# Patient Record
Sex: Female | Born: 1990 | Race: White | Marital: Single | Smoking: Never smoker
Health system: Southern US, Community
[De-identification: ages and names within clinical notes are randomized; demographics above are authoritative.]

---

## 2018-04-29 ENCOUNTER — Ambulatory Visit: Payer: Self-pay | Admitting: Family Medicine

## 2018-04-29 ENCOUNTER — Other Ambulatory Visit: Payer: Self-pay

## 2018-04-29 ENCOUNTER — Ambulatory Visit
Admission: RE | Admit: 2018-04-29 | Discharge: 2018-04-29 | Disposition: A | Discharge status: Home / Self Care | Payer: Self-pay | Source: Ambulatory Visit | Attending: Family Medicine | Admitting: Family Medicine

## 2018-04-29 ENCOUNTER — Encounter: Payer: Self-pay | Admitting: Family Medicine

## 2018-04-29 VITALS — BP 98/64 | HR 77 | Temp 98.2°F | Ht 62.5 in | Wt 104.6 lb

## 2018-04-29 DIAGNOSIS — Y9315 Activity, underwater diving and snorkeling: Secondary | ICD-10-CM

## 2018-04-29 DIAGNOSIS — Z0289 Encounter for other administrative examinations: Secondary | ICD-10-CM

## 2018-04-29 LAB — POCT UA - GLUCOSE/PROTEIN
GLUCOSE UA: NEGATIVE
Protein, UA: NEGATIVE

## 2018-04-29 LAB — GLUCOSE, POCT (MANUAL RESULT ENTRY): POC GLUCOSE: 89 mg/dL (ref 70–99)

## 2018-04-29 LAB — POCT HEMOGLOBIN: Hemoglobin: 14.2 g/dL — AB (ref 9.5–13.5)

## 2018-04-29 NOTE — Progress Notes (Signed)
S:    Patient arrives in good spirits without complaint.    Presents for lung function evaluation for "dive physical". Patient reports breathing has been without issue.   Denies history or breathing difficulty.   O: Patient provided good effort while attempting spirometry.  FVC 3.40    Calculated Lower Limit for [employer] Diving Standards   FVC = 94.4 FEV1 3.02       Calculated Lower Limit for [employer] Diving Standards   FEV1= 96.8 FEV1/FVC  88.7   Calculated Lower Limit for [employer] Diving Standards   FEV1/FVC = 103.8  See "scanned report" or Documentation Flowsheet (discrete results - PFTs) for  Spirometry results and copy of evaluation.   A/P: Spirometry evaluation without bronchodilator reveals normal lung function.  FEV1, FVC and FEV1/FVC ratio all exceed threshold for spirometric parameters.   Reviewed results of pulmonary function tests.  Pt verbalized understanding of results and education.  Patient seen with Belva Agee, PharmD Candidate.

## 2018-04-29 NOTE — Progress Notes (Signed)
Subjective:    Melissa Hampton is a 27 y.o. female who presents to [facility] today for scuba diving physical for the [facility]:  1.  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  Denies any complications or injuries while diving.  Has never failed a fitness to dive physical.  Currently well, without complaints.  The following portions of the patient's history were reviewed and updated as appropriate: allergies, current medications, past medical history, family and social history, and problem list.  Medications reviewed. Current Outpatient Medications  Medication Sig Dispense Refill  . etonogestrel (NEXPLANON) 68 MG IMPL implant 1 each by Subdermal route once.     No current facility-administered medications for this visit.      PMH:   - No diagnoses that she knows of, or medical conditions. - No other hospitalizations or other prior medical history   PSH: - denies any surgery.  S/p Nexplanon insertion  Family History: - no family history of cardiac, pulmonary, neurological issues.    Social: - Never smoker - Denies illicit drug use - Very occasional social drinker (1-2 drinks)  SCUBA ROS:  He denies any history of middle ear trauma/disease, vertigo, ocular surgery, asthma or other respiratory issues, seizures, loss of consciousness, recurring neurologic disorders, history of head injury, coagulopathies, evidence of CAD or other structural heart disease, pneumothorax, diabetes, or exercise intolerance.    General ROS:  The patient denies fever, unusual weight change, decreased hearing, chest pain, palpitations, pre-syncopal or syncopal episodes, dyspnea on exertion, prolonged cough, hemoptysis, change in bowel habits, melena, hematochezia, severe indigestion/heartburn, nausea/vomiting/abdominal pain, genital sores, muscle weakness, difficulty walking, abnormal bleeding, or enlarged lymph nodes.     Objective:   Physical  Exam BP 98/64   Pulse 77   Temp 98.2 F (36.8 C) (Oral)   Ht 5' 2.5" (1.588 m)   Wt 104 lb 9.6 oz (47.4 kg)   SpO2 99%   BMI 18.83 kg/m  Gen:  Alert, cooperative patient who appears stated age in no acute distress.  Vital signs reviewed. Head:  [place]/AT Eyes:  Fundoscopy WNL BL.  PERRL, EOMI Ears:  External ears WNL, Bilateral TM's normal without retraction, redness or bulging.  Canals clear BL  Mouth:  Good dental hygiene. Tonsils non-erythematous, non-edematous.   MMM Neck:  Trachea midline Cardiac:  Regular rate and rhythm without murmur auscultated.   Pulm:  Clear to auscultation bilaterally with good air movement throuhout.  No wheezes or rales noted.   Abd:  Soft/nondistended/nontender.  Good bowel sounds throughout all four quadrants.  No masses noted.  Exts: No edema BL LE's, warm and well-perfused Neuro:  Alert and oriented to person, place, and date.  CN II-XII intact.  Sensation intact to light touch and vibration bilateral upper and lower extremities equally.  Motor function equal and strength 5/5 bilateral upper and lower extremities.  Normal gait.  DTRs +2 BL patellar.  Finger to nose cerebellar testing within normal limits.  Color vision testing normal. Psych:  Not depressed or anxious appearing.  Linear and coherent thought process as evidenced by speech pattern. Smiles spontaneously.   Results for orders placed or performed in visit on 04/29/18 (from the past 72 hour(s))  Glucose (CBG)     Status: None   Collection Time: 04/29/18  9:30 AM  Result Value Ref Range   POC Glucose 89 70 - 99 mg/dl  Hemoglobin     Status: Abnormal   Collection Time: 04/29/18  9:30 AM  Result Value Ref Range   Hemoglobin 14.2 (A) 9.5 - 13.5 g/dL  Urinalysis - Glucose/Protein     Status: None   Collection Time: 04/29/18  9:30 AM  Result Value Ref Range   Glucose, UA Negative Negative   Protein, UA Negative Negative

## 2018-04-29 NOTE — Assessment & Plan Note (Signed)
Vision (distance, near, color), hearing, UA, CBG, Hgb, and Spirometry all within normal limits. Normal CXR  EKG:  n/a Coronary assessment:  N/a due to age Approval for SCUBA diving, I find no medical conditions considered incompatible with diving.    

## 2018-04-29 NOTE — Patient Instructions (Signed)
It was good to meet you today.   Good luck with work.  [facility], and then come back to pick up your paperwork.

## 2018-05-15 NOTE — Progress Notes (Signed)
Chief Complaint  Patient presents with  . Establish Care    discuss left knee issues x intermittent for years but has worsened over the last 1-2 months    HPI  Patient reports that she has been having intermittent knee pain on the left for years She resumed fencing as a sport and with lunges she feels crunching and pain with the left knee pain She has no weakness of the knee She has not tried any medications to alleviate this Her mother and grandmother both have arthritis  History reviewed. No pertinent past medical history.  Current Outpatient Medications  Medication Sig Dispense Refill  . etonogestrel (NEXPLANON) 68 MG IMPL implant 1 each by Subdermal route once.    . meloxicam (MOBIC) 7.5 MG tablet Take 1 tablet (7.5 mg total) by mouth daily. Take with food. 30 tablet 6   No current facility-administered medications for this visit.     Allergies:  Allergies  Allergen Reactions  . Morphine And Related     History reviewed. No pertinent surgical history.  Social History   Socioeconomic History  . Marital status: Single    Spouse name: Not on file  . Number of children: Not on file  . Years of education: Not on file  . Highest education level: Not on file  Occupational History  . Not on file  Social Needs  . Financial resource strain: Not on file  . Food insecurity:    Worry: Not on file    Inability: Not on file  . Transportation needs:    Medical: Not on file    Non-medical: Not on file  Tobacco Use  . Smoking status: Never Smoker  . Smokeless tobacco: Never Used  Substance and Sexual Activity  . Alcohol use: Not on file  . Drug use: Not on file  . Sexual activity: Not on file  Lifestyle  . Physical activity:    Days per week: Not on file    Minutes per session: Not on file  . Stress: Not on file  Relationships  . Social connections:    Talks on phone: Not on file    Gets together: Not on file    Attends religious service: Not on file    Active  member of club or organization: Not on file    Attends meetings of clubs or organizations: Not on file    Relationship status: Not on file  Other Topics Concern  . Not on file  Social History Narrative  . Not on file    Family History  Problem Relation Age of Onset  . Arthritis Mother   . Arthritis Maternal Grandmother      ROS Review of Systems See HPI Constitution: No fevers or chills No malaise No diaphoresis Skin: No rash or itching Eyes: no blurry vision, no double vision GU: no dysuria or hematuria Neuro: no dizziness or headaches all others reviewed and negative   Objective: Vitals:   05/16/18 0948  BP: 123/81  Pulse: 95  Resp: 16  Temp: 98.8 F (37.1 C)  TempSrc: Oral  SpO2: 98%  Weight: 104 lb (47.2 kg)  Height: 5' 2.5" (1.588 m)    Physical Exam  Constitutional: She is oriented to person, place, and time. She appears well-developed and well-nourished.  HENT:  Head: Normocephalic and atraumatic.  Eyes: Conjunctivae and EOM are normal.  Pulmonary/Chest: Effort normal.  Musculoskeletal:  Left knee without crepitus, no effusion, no joint line tenderness, no clicks, normal alignment  Neurological: She  is alert and oriented to person, place, and time.      Left knee xray without bone abnormalities No effusion   Assessment and Plan Melissa AmenJulia was seen today for establish care.  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 Take with food.     Melissa Hampton A Sagan Maselli

## 2018-05-16 ENCOUNTER — Other Ambulatory Visit: Payer: Self-pay

## 2018-05-16 ENCOUNTER — Ambulatory Visit (INDEPENDENT_AMBULATORY_CARE_PROVIDER_SITE_OTHER): Payer: BLUE CROSS/BLUE SHIELD

## 2018-05-16 ENCOUNTER — Encounter: Payer: Self-pay | Admitting: Family Medicine

## 2018-05-16 ENCOUNTER — Ambulatory Visit: Payer: BLUE CROSS/BLUE SHIELD | Admitting: Family Medicine

## 2018-05-16 VITALS — BP 123/81 | HR 95 | Temp 98.8°F | Resp 16 | Ht 62.5 in | Wt 104.0 lb

## 2018-05-16 DIAGNOSIS — M25562 Pain in left knee: Secondary | ICD-10-CM

## 2018-05-16 DIAGNOSIS — G8929 Other chronic pain: Secondary | ICD-10-CM

## 2018-05-16 MED ORDER — MELOXICAM 7.5 MG PO TABS: 7.5000 mg | ORAL_TABLET | Freq: Every day | ORAL | 6 refills | Status: AC

## 2018-05-16 NOTE — Patient Instructions (Addendum)
   If you have lab work done today you will be contacted with your lab results within the next 2 weeks.  If you have not heard from us then please contact us. The fastest way to get your results is to register for My Chart.   IF you received an x-ray today, you will receive an invoice from [facility]. Please contact [facility] at [phone] with questions or concerns regarding your invoice.   IF you received labwork today, you will receive an invoice from [facility]. Please contact [facility] at [phone] with questions or concerns regarding your invoice.   Our billing staff will not be able to assist you with questions regarding bills from these companies.  You will be contacted with the lab results as soon as they are available. The fastest way to get your results is to activate your My Chart account. Instructions are located on the last page of this paperwork. If you have not heard from us regarding the results in 2 weeks, please contact this office.     Knee Pain, Adult Knee pain in adults is common. It can be caused by many things, including:  Arthritis.  A fluid-filled sac (cyst) or growth in your knee.  An infection in your knee.  An injury that will not heal.  Damage, swelling, or irritation of the tissues that support your knee.  Knee pain is usually not a sign of a serious problem. The pain may go away on its own with time and rest. If it does not, a health care provider may order tests to find the cause of the pain. These may include:  Imaging tests, such as an X-ray, MRI, or ultrasound.  Joint aspiration. In this test, fluid is removed from the knee.  Arthroscopy. In this test, a lighted tube is inserted into knee and an image is projected onto a TV screen.  A biopsy. In this test, a sample of tissue is removed from the body and studied under a microscope.  Follow these instructions at home: Pay attention to any changes in your symptoms. Take  these actions to relieve your pain. Activity  Rest your knee.  Do not do things that cause pain or make pain worse.  Avoid high-impact activities or exercises, such as running, jumping rope, or doing jumping jacks. General instructions  Take over-the-counter and prescription medicines only as told by your health care provider.  Raise (elevate) your knee above the level of your heart when you are sitting or lying down.  Sleep with a pillow under your knee.  If directed, apply ice to the knee: ? Put ice in a plastic bag. ? Place a towel between your skin and the bag. ? Leave the ice on for 20 minutes, 2-3 times a day.  Ask your health care provider if you should wear an elastic knee support.  Lose weight if you are overweight. Extra weight can put pressure on your knee.  Do not use any products that contain nicotine or tobacco, such as cigarettes and e-cigarettes. Smoking may slow the healing of any bone and joint problems that you may have. If you need help quitting, ask your health care provider. Contact a health care provider if:  Your knee pain continues, changes, or gets worse.  You have a fever along with knee pain.  Your knee buckles or locks up.  Your knee swells, and the swelling becomes worse. Get help right away if:  Your knee feels warm to the touch.    You cannot move your knee.  You have severe pain in your knee.  You have chest pain.  You have trouble breathing. Summary  Knee pain in adults is common. It can be caused by many things, including, arthritis, infection, cysts, or injury.  Knee pain is usually not a sign of a serious problem, but if it does not go away, a health care provider may perform tests to know the cause of the pain.  Pay attention to any changes in your symptoms. Relieve your pain with rest, medicines, light activity, and use of ice.  Get help if your pain continues or becomes very severe, or if your knee buckles or locks up, or if  you have chest pain or trouble breathing. This information is not intended to replace advice given to you by your health care provider. Make sure you discuss any questions you have with your health care provider. Document Released: 04/15/2007 Document Revised: 06/08/2016 Document Reviewed: 06/08/2016 Elsevier Interactive Patient Education  2018 [employer]vier Inc[employer]  

## 2018-07-16 ENCOUNTER — Emergency Department (HOSPITAL_COMMUNITY): Payer: BLUE CROSS/BLUE SHIELD

## 2018-07-16 ENCOUNTER — Emergency Department (HOSPITAL_COMMUNITY)
Admission: EM | Admit: 2018-07-16 | Discharge: 2018-07-16 | Disposition: A | Discharge status: Home / Self Care | Payer: BLUE CROSS/BLUE SHIELD | Attending: Emergency Medicine | Admitting: Emergency Medicine

## 2018-07-16 ENCOUNTER — Other Ambulatory Visit: Payer: Self-pay

## 2018-07-16 ENCOUNTER — Encounter (HOSPITAL_COMMUNITY): Payer: Self-pay | Admitting: Emergency Medicine

## 2018-07-16 DIAGNOSIS — Z79899 Other long term (current) drug therapy: Secondary | ICD-10-CM | POA: Diagnosis not present

## 2018-07-16 DIAGNOSIS — M7071 Other bursitis of hip, right hip: Secondary | ICD-10-CM | POA: Diagnosis not present

## 2018-07-16 DIAGNOSIS — Y999 Unspecified external cause status: Secondary | ICD-10-CM | POA: Diagnosis not present

## 2018-07-16 DIAGNOSIS — Y9301 Activity, walking, marching and hiking: Secondary | ICD-10-CM | POA: Insufficient documentation

## 2018-07-16 DIAGNOSIS — M25551 Pain in right hip: Secondary | ICD-10-CM | POA: Diagnosis present

## 2018-07-16 LAB — POC URINE PREG, ED: PREG TEST UR: NEGATIVE

## 2018-07-16 NOTE — ED Provider Notes (Signed)
[facility]-EMERGENCY DEPT Provider Note   CSN: [id number] Arrival date & time: 07/16/18  2055     History   Chief Complaint Chief Complaint  Patient presents with  . Leg Pain    HPI Dipali Basquez is a 28 y.o. female.  28 year old female presents with complaint of pain in her right hip and lateral thigh.  Patient states pain started yesterday without injury, located lateral distal thigh, now radiates to right hip area, worse with flexion of the hip and walking.  Denies back pain, abdominal pain, leg weakness or numbness, rashes.  Patient reports recent minor cold, no fevers.  No other complaints or concerns. Patient took Meloxicam earlier today (prescribed for knee pain to take when fencing, does not take regularly). No other complaints or concerns.      History reviewed. No pertinent past medical history.  Patient Active Problem List   Diagnosis Date Noted  . Activities involving scuba diving 04/29/2018    History reviewed. No pertinent surgical history.   OB History   No obstetric history on file.      Home Medications    Prior to Admission medications   Medication Sig Start Date End Date Taking? Authorizing Provider  etonogestrel (NEXPLANON) 68 MG IMPL implant 1 each by Subdermal route once.    [provider]  meloxicam (MOBIC) 7.5 MG tablet Take 1 tablet (7.5 mg total) by mouth daily. Take with food. 05/16/18   Doristine Bosworth, MD    Family History Family History  Problem Relation Age of Onset  . Arthritis Mother   . Arthritis Maternal Grandmother     Social History Social History   Tobacco Use  . Smoking status: Never Smoker  . Smokeless tobacco: Never Used  Substance Use Topics  . Alcohol use: Not on file  . Drug use: Not on file     Allergies   Morphine and related   Review of Systems Review of Systems  Constitutional: Negative for fever.  Gastrointestinal: Negative for abdominal pain.  Musculoskeletal:  Positive for arthralgias, gait problem and myalgias. Negative for back pain and joint swelling.  Skin: Negative for color change, rash and wound.  Allergic/Immunologic: Negative for immunocompromised state.  Neurological: Negative for weakness and numbness.  Hematological: Negative for adenopathy. Does not bruise/bleed easily.  Psychiatric/Behavioral: Negative for confusion.     Physical Exam Updated Vital Signs BP 123/90   Pulse (!) 103   Temp 99.3 F (37.4 C) (Oral)   Resp 18   SpO2 98%   Physical Exam Vitals signs and nursing note reviewed.  Constitutional:      General: She is not in acute distress.    Appearance: Normal appearance. She is well-developed. She is not diaphoretic.  HENT:     Head: Normocephalic and atraumatic.  Cardiovascular:     Pulses: Normal pulses.  Pulmonary:     Effort: Pulmonary effort is normal.  Abdominal:     Tenderness: There is no abdominal tenderness.  Musculoskeletal:        General: Tenderness present. No swelling.     Right hip: She exhibits decreased range of motion and tenderness. She exhibits no swelling and no crepitus.     Left hip: Normal.     Right lower leg: No edema.     Left lower leg: No edema.       Legs:     Comments: Pain with flexion and rotation of the right hip, TTP laterally  Skin:  General: Skin is warm and dry.     Findings: No erythema or rash.  Neurological:     Mental Status: She is alert and oriented to person, place, and time.  Psychiatric:        Behavior: Behavior normal.      ED Treatments / Results  Labs (all labs ordered are listed, but only abnormal results are displayed) Labs Reviewed - No data to display  EKG None  Radiology No results found.  Procedures Procedures (including critical care time)  Medications Ordered in ED Medications - No data to display   Initial Impression / Assessment and Plan / ED Course  I have reviewed the triage vital signs and the nursing  notes.  Pertinent labs & imaging results that were available during my care of the patient were reviewed by me and considered in my medical decision making (see chart for details).  Clinical Course as of Jul 16 2244  Wed Jul 16, 2018  2988 28 year old female presents with complaint of pain in her right hip without injury, no relief with meloxicam.  Pain is worse with rotation of the hip and flexion.  Patient reports recent illness, differential diagnosis includes transient synovitis, bursitis, strain.  X-ray ordered for further evaluation.  Patient is advised to use crutches, take 10-day course of meloxicam and follow-up with her orthopedic.   [LM]    Clinical Course User Index [LM] Jeannie Fend, PA-C   Final Clinical Impressions(s) / ED Diagnoses   Final diagnoses:  None    ED Discharge Orders    None       Alden Hipp 07/16/18 2303    Maia Plan, MD 07/17/18 1011

## 2018-07-16 NOTE — Discharge Instructions (Addendum)
Your x-ray did not show any fracture, broken bone, bony abnormality. Take Meloxicam x 10 days with food. Alternate ice/heat for 20 minutes each. Use crutches, weight bear as tolerated. Follow up with your orthopedist or primary care if pain continues.

## 2018-07-16 NOTE — ED Notes (Signed)
Pt verbalized discharge instructions and follow up care. Alert and ambulatory  

## 2018-07-16 NOTE — ED Triage Notes (Signed)
Patient reports right quad pain since "waking in the middle of the night." Denies injury. Reports pain has worsened today. States it worsens with movement. Ambulatory.

## 2018-07-24 NOTE — Addendum Note (Signed)
Addended by: Kathrin Ruddy on: 07/24/2018 04:52 PM   Modules accepted: Orders

## 2019-06-13 IMAGING — CR DG HIP (WITH OR WITHOUT PELVIS) 2-3V*R*
3 series · 3 of 3 positions shown · non-contrast
Comparison: None.

CLINICAL DATA: Right hip pain since this morning, getting worse all
day. Radiates down the leg. No injury.

EXAM:
DG HIP (WITH OR WITHOUT PELVIS) 2-3V RIGHT

[t pelvis ap]
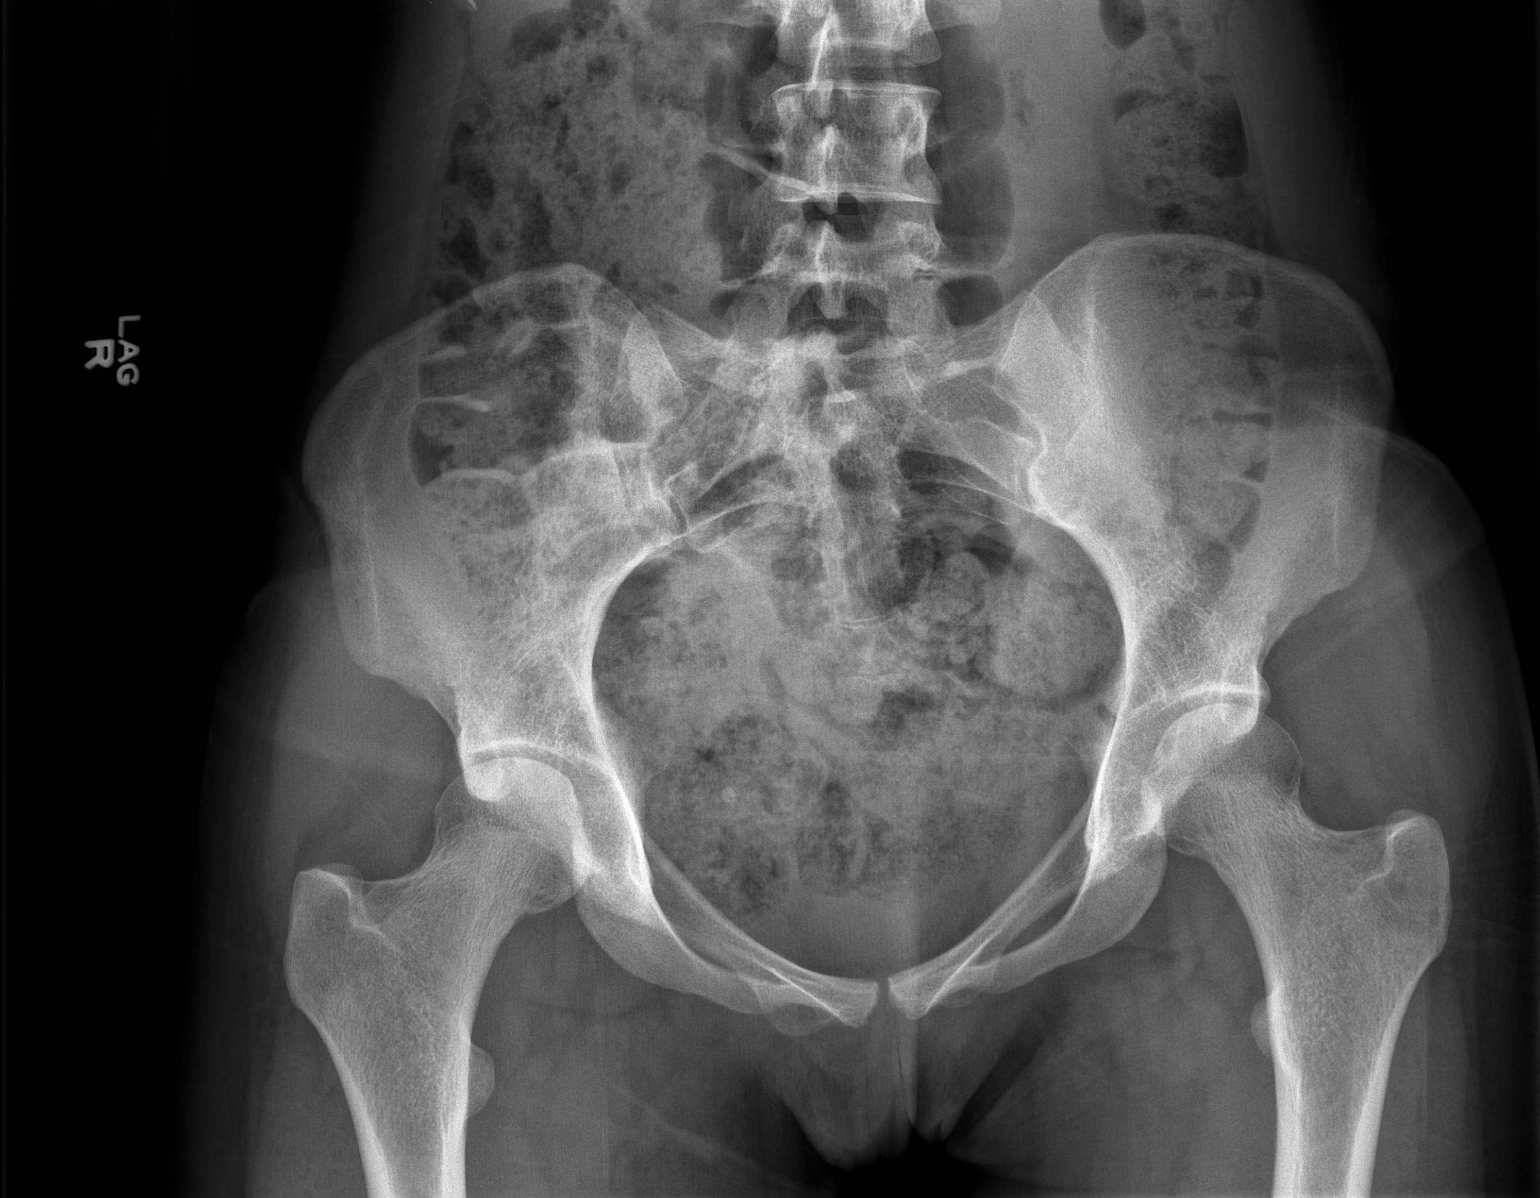

[t hip ap right]
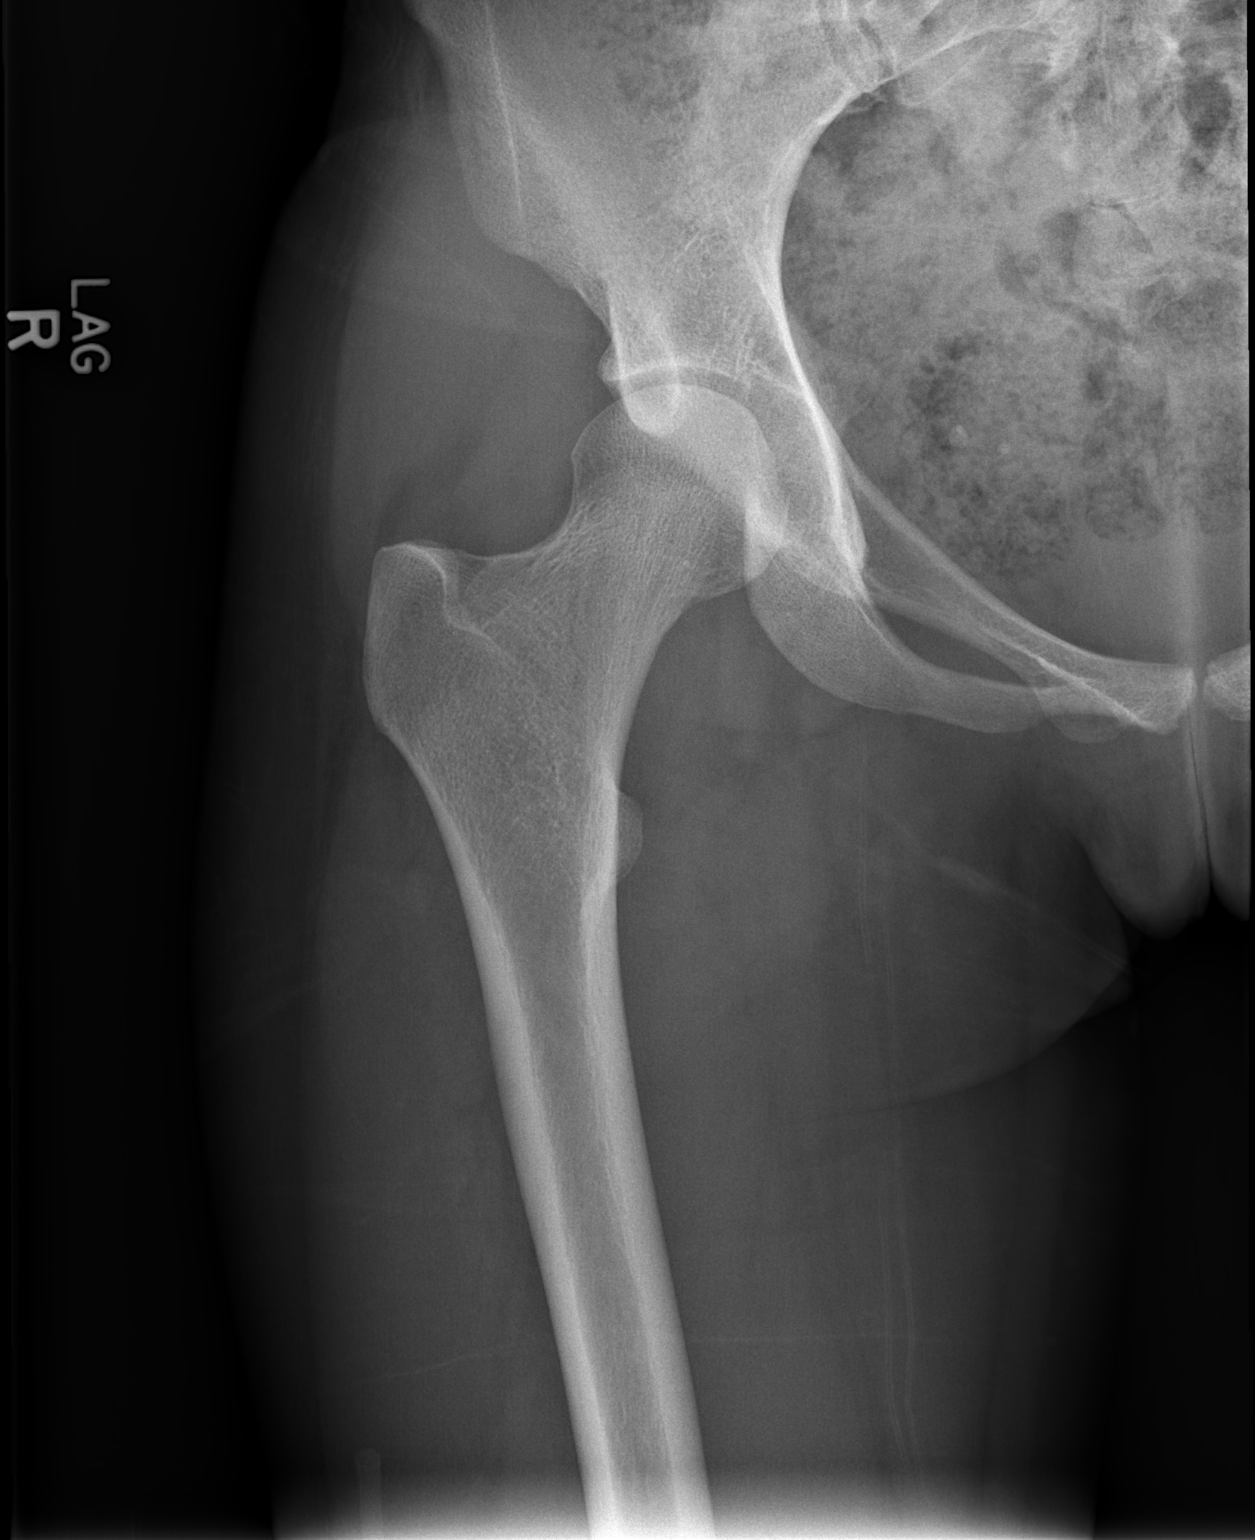

[t hip frog leg right]
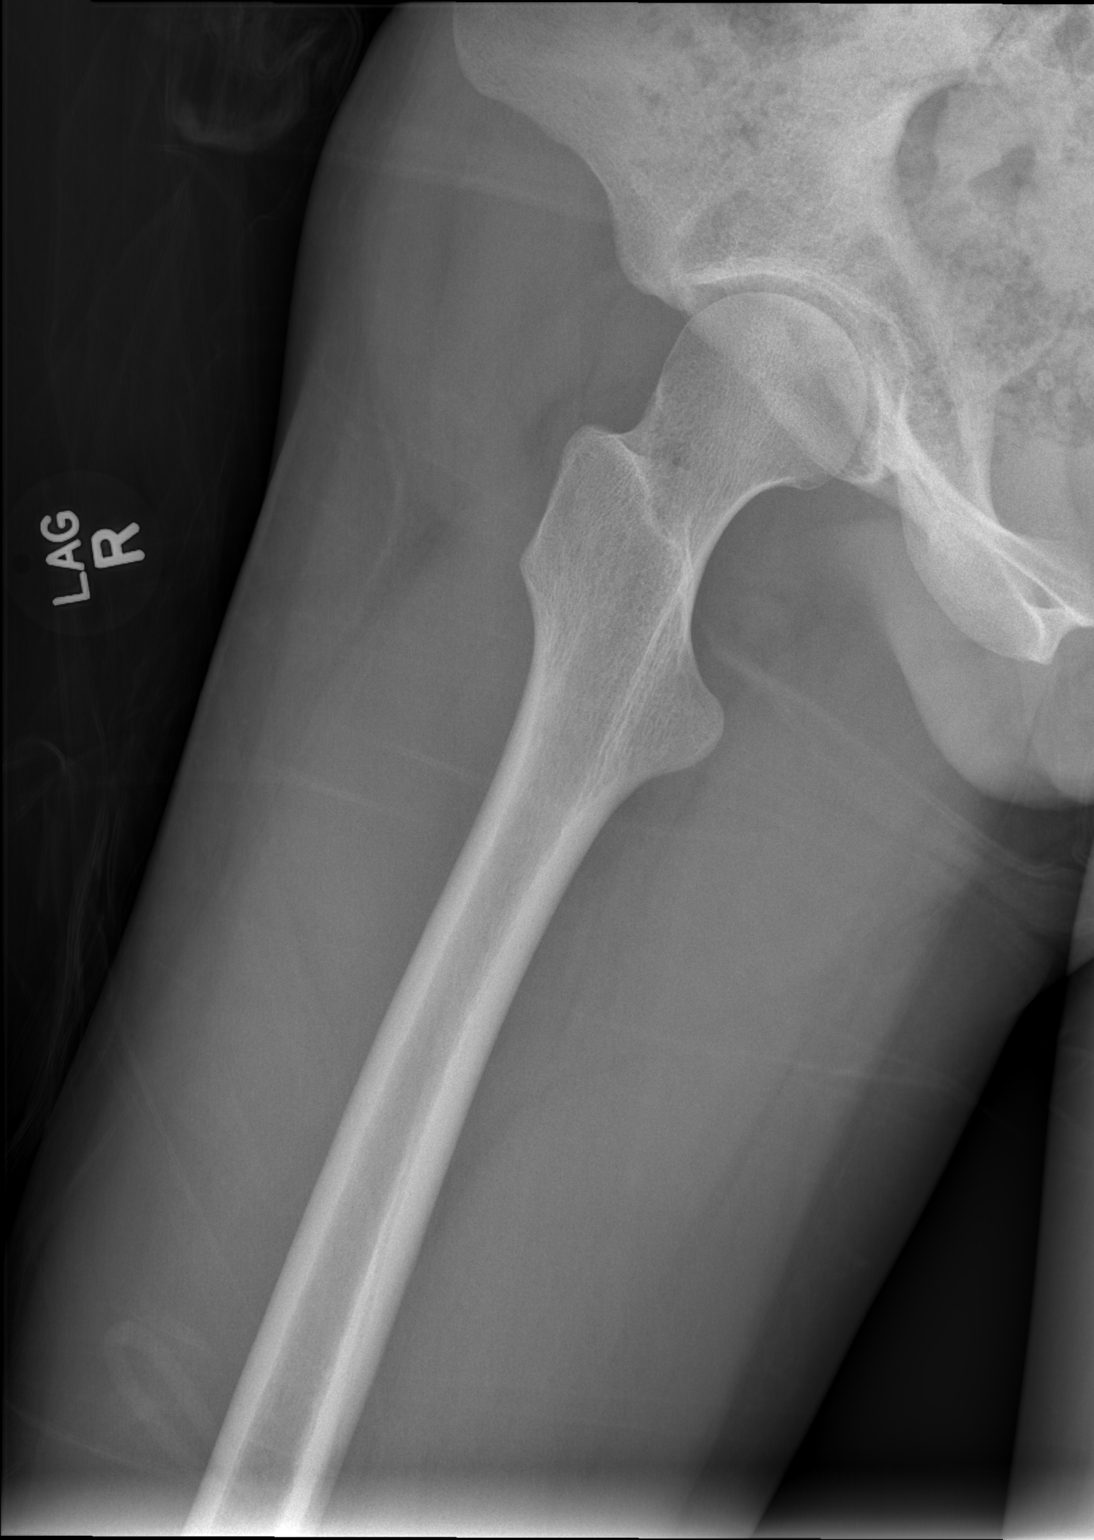

[3 of 3 positions shown; findings below may reference images not displayed]

FINDINGS: There is no evidence of hip fracture or dislocation. There is no
evidence of arthropathy or other focal bone abnormality. Moderate
stool burden in the colon.
IMPRESSION: No acute bony abnormalities.
